# Patient Record
Sex: Male | Born: 1975 | Race: White | Hispanic: No | Marital: Single | Smoking: Current every day smoker
Health system: Southern US, Community
[De-identification: ages and names within clinical notes are randomized; demographics above are authoritative.]

## PROBLEM LIST (undated history)

## (undated) DIAGNOSIS — I252 Old myocardial infarction: Secondary | ICD-10-CM

---

## 2016-05-12 ENCOUNTER — Emergency Department (HOSPITAL_COMMUNITY)
Admission: EM | Admit: 2016-05-12 | Discharge: 2016-05-12 | Disposition: A | Discharge status: Home / Self Care | Payer: Self-pay | Attending: Emergency Medicine | Admitting: Emergency Medicine

## 2016-05-12 ENCOUNTER — Emergency Department (HOSPITAL_COMMUNITY): Payer: Self-pay

## 2016-05-12 ENCOUNTER — Encounter (HOSPITAL_COMMUNITY): Payer: Self-pay | Admitting: Emergency Medicine

## 2016-05-12 DIAGNOSIS — R1030 Lower abdominal pain, unspecified: Secondary | ICD-10-CM | POA: Insufficient documentation

## 2016-05-12 DIAGNOSIS — R0789 Other chest pain: Secondary | ICD-10-CM | POA: Insufficient documentation

## 2016-05-12 DIAGNOSIS — I252 Old myocardial infarction: Secondary | ICD-10-CM | POA: Insufficient documentation

## 2016-05-12 DIAGNOSIS — R079 Chest pain, unspecified: Secondary | ICD-10-CM

## 2016-05-12 DIAGNOSIS — F17210 Nicotine dependence, cigarettes, uncomplicated: Secondary | ICD-10-CM | POA: Insufficient documentation

## 2016-05-12 HISTORY — DX: Old myocardial infarction: I25.2

## 2016-05-12 LAB — CBC
HCT: 38.6 % — ABNORMAL LOW (ref 39.0–52.0)
HEMOGLOBIN: 13.4 g/dL (ref 13.0–17.0)
MCH: 29.7 pg (ref 26.0–34.0)
MCHC: 34.7 g/dL (ref 30.0–36.0)
MCV: 85.6 fL (ref 78.0–100.0)
Platelets: 263 10*3/uL (ref 150–400)
RBC: 4.51 MIL/uL (ref 4.22–5.81)
RDW: 13.5 % (ref 11.5–15.5)
WBC: 8.6 10*3/uL (ref 4.0–10.5)

## 2016-05-12 LAB — COMPREHENSIVE METABOLIC PANEL
ALBUMIN: 3.8 g/dL (ref 3.5–5.0)
ALK PHOS: 64 U/L (ref 38–126)
ALT: 28 U/L (ref 17–63)
AST: 23 U/L (ref 15–41)
Anion gap: 9 (ref 5–15)
BUN: 12 mg/dL (ref 6–20)
CALCIUM: 9.2 mg/dL (ref 8.9–10.3)
CHLORIDE: 106 mmol/L (ref 101–111)
CO2: 23 mmol/L (ref 22–32)
Creatinine, Ser: 0.71 mg/dL (ref 0.61–1.24)
GFR calc Af Amer: >60 mL/min (ref >60)
GFR calc non Af Amer: >60 mL/min (ref >60)
GLUCOSE: 93 mg/dL (ref 65–99)
Potassium: 4.1 mmol/L (ref 3.5–5.1)
SODIUM: 138 mmol/L (ref 135–145)
Total Bilirubin: 1.1 mg/dL (ref 0.3–1.2)
Total Protein: 6.6 g/dL (ref 6.5–8.1)

## 2016-05-12 LAB — LIPASE, BLOOD: LIPASE: 20 U/L (ref 11–51)

## 2016-05-12 LAB — I-STAT TROPONIN, ED: TROPONIN I, POC: 0 ng/mL (ref 0.00–0.08)

## 2016-05-12 MED ORDER — IBUPROFEN 800 MG PO TABS
800.0000 mg | ORAL_TABLET | Freq: Once | ORAL | Status: AC
  Administered 2016-05-12: 800 mg via ORAL
  Filled 2016-05-12: qty 1

## 2016-05-12 NOTE — ED Provider Notes (Signed)
[facility]-EMERGENCY DEPT Provider Note   CSN: [id number] Arrival date & time: 05/12/16  1027     History   Chief Complaint Chief Complaint  Patient presents with  . Chest Pain  . Abdominal Pain    HPI Peter Everett is a 41 y.o. male.  The history is provided by the patient.  Chest Pain   This is a new problem. The problem occurs constantly. The problem has not changed since onset.The pain is present in the substernal region. The pain is moderate. The quality of the pain is described as sharp. The pain radiates to the left shoulder. Duration of episode(s) is 4 hours. The symptoms are aggravated by certain positions. Associated symptoms include abdominal pain. He has tried nitroglycerin (aspirin) for the symptoms. Risk factors include male gender.  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  Abdominal Pain   This is a new problem. Episode onset: 4 hours ago. The problem occurs constantly. The problem has not changed since onset.Pain location: right lower. The pain is mild. Nothing aggravates the symptoms. Nothing relieves the symptoms.    Past Medical History:  Diagnosis Date  . MI, old     There are no active problems to display for this patient.   No past surgical history on file.     Home Medications    Prior to Admission medications   Not on File    Family History No family history on file.  Social History Social History  Substance Use Topics  . Smoking status: Current Every Day Smoker    Packs/day: 2.00    Types: Cigarettes  . Smokeless tobacco: Current User    Types: Chew  . Alcohol use No     Allergies   Patient has  no known allergies.   Review of Systems Review of Systems  Cardiovascular: Positive for chest pain.  Gastrointestinal: Positive for abdominal pain.  All other systems reviewed and are negative.    Physical Exam Updated Vital Signs BP 102/71 (BP Location: Right Arm)   Pulse 75   Temp 98.2 F (36.8 C) (Oral)   Resp 16   SpO2 95%   Physical Exam  Constitutional: He is oriented to person, place, and time. He appears well-developed and well-nourished. No distress.  HENT:  Head: Normocephalic and atraumatic.  Nose: Nose normal.  Eyes: Conjunctivae are normal.  Neck: Neck supple. No tracheal deviation present.  Cardiovascular: Normal rate, regular rhythm and normal heart sounds.   Pulmonary/Chest: Effort normal and breath sounds normal. No respiratory distress.  Abdominal: Soft. He exhibits no distension. There is no tenderness. There is no rebound and no guarding.  Neurological: He is alert and oriented to person, place, and time.  Skin: Skin is warm and dry.  Psychiatric: He has a normal mood and affect.  Vitals reviewed.    ED Treatments / Results  Labs (all labs ordered are listed, but only abnormal results are displayed) Labs Reviewed  CBC - Abnormal; Notable for the following:       Result Value   HCT 38.6 (*)    All other components within normal limits  LIPASE, BLOOD  COMPREHENSIVE METABOLIC PANEL  URINALYSIS, ROUTINE W REFLEX MICROSCOPIC  I-STAT TROPOININ, ED    EKG  EKG Interpretation  Date/Time:  Wednesday May 12 2016 10:43:23 EST Ventricular Rate:  77 PR Interval:  134 QRS Duration: 88 QT Interval:  378 QTC Calculation: 427 R Axis:   58 Text Interpretation:  Sinus rhythm with Blocked Premature atrial complexes Otherwise normal ECG No previous tracing Confirmed by Masie Bermingham MD, Baruch Lewers [phone]) on 05/12/2016 10:46:34 AM       Radiology Dg Chest 2 View  Result Date: 05/12/2016 CLINICAL DATA:  Chest pain and shortness of breath. Chest pain radiates  to the left arm. EXAM: CHEST  2 VIEW COMPARISON:  None. FINDINGS: Trachea is midline. Heart size is accentuated by AP technique and somewhat low lung volumes. Lungs are clear. No pleural fluid. IMPRESSION: No acute findings. Electronically Signed   By: Leanna Battles M.D.   On: 05/12/2016 11:11    Procedures Procedures (including critical care time)  Medications Ordered in ED Medications  ibuprofen (ADVIL,MOTRIN) tablet 800 mg (800 mg Oral Given 05/12/16 1308)     Initial Impression / Assessment and Plan / ED Course  I have reviewed the triage vital signs and the nursing notes.  Pertinent labs & imaging results that were available during my care of the patient were reviewed by me and considered in my medical decision making (see chart for details).     41 year old male presents with sudden onset sharp left-sided chest pain that has completely resolved upon arrival to the emergency department. It is highly atypical in nature. He states that he has a history of prior MI but there are no records of him visiting the hospital in [facility] where he states this occurred. He also states that he has no stents in place. He tells me that he has had his gallbladder out at [facility] in [place] but there is also no record available from them to demonstrate this. He has well-healed abdominal incisions. No abdominal tenderness, no abnormalities on exam. He states that he has "a high tolerance for pain and usually Tylenol and ibuprofen does not work. I confronted the patient about his high risk behavior for drug-seeking and told him that I was concerned that he may have a problem. He tells me that he travels often for his job. He is in no acute distress, is afebrile with stable vital signs and lab work including troponin, liver function tests, CBC and metabolic panel with lipase are all unremarkable. At this time I do not feel that there is evidence of a medical emergency. Return precautions  were discussed for worsening or new concerning symptoms and I recommended the patient follow with a primary care physician to further evaluate his pain if it recurs before now he is pain-free.  Final Clinical Impressions(s) / ED Diagnoses   Final diagnoses:  Nonspecific chest pain  Lower abdominal pain    New Prescriptions New Prescriptions   No medications on file     Lyndal Pulley, MD 05/12/16 1320

## 2016-05-12 NOTE — ED Notes (Signed)
Pt unable to void at this time. Pt state "I peed right before I came here."

## 2016-05-12 NOTE — ED Notes (Signed)
Pt verbalized understanding of d/c instructions and has no further questions. Pt stable and NAD.  

## 2016-05-12 NOTE — ED Triage Notes (Signed)
Pt from [facility] via [facility] with c/o central sharp chest pain radiating to the left arm starting approx 2 hours [place] [employer]  Pt reports 10/10 pain with no relief from 1 nitro.  Given 3 24 mg aspirin.  Denies N/V, SOB, or diaphoresis.  Pt additionally reports RLQ pain 10/10 from flank to back with no urinary complaints.  Pt reports hx of gallbladder removal 6 weeks ago and hx of MI but pain was a pressure at that time.  NAD, A&O.

## 2018-04-19 IMAGING — DX DG CHEST 2V
2 series · 2 of 2 positions shown · non-contrast
Comparison: None.

CLINICAL DATA: Chest pain and shortness of breath. Chest pain
radiates to the left arm.

EXAM:
CHEST  2 VIEW

[chest lat]
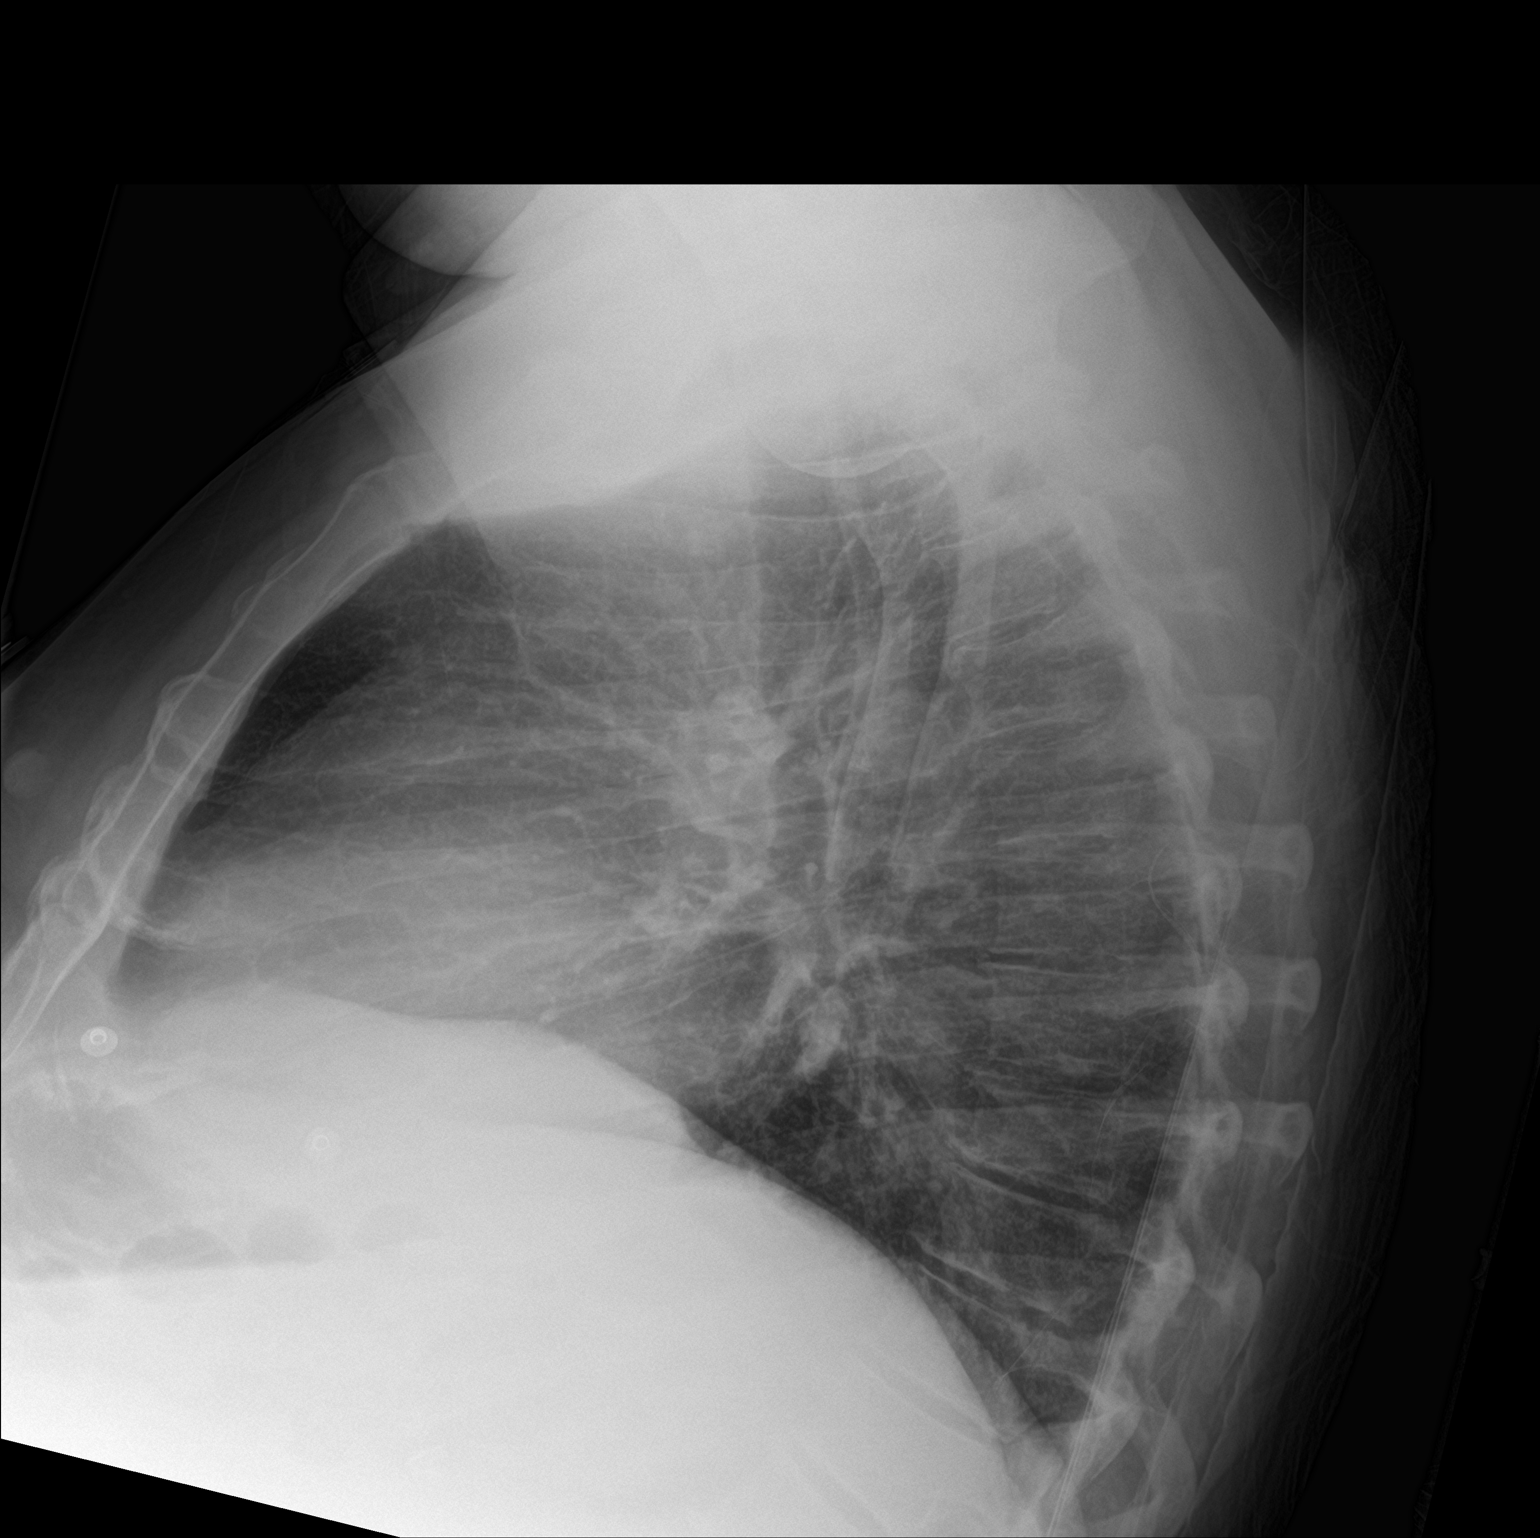

[chest ap]
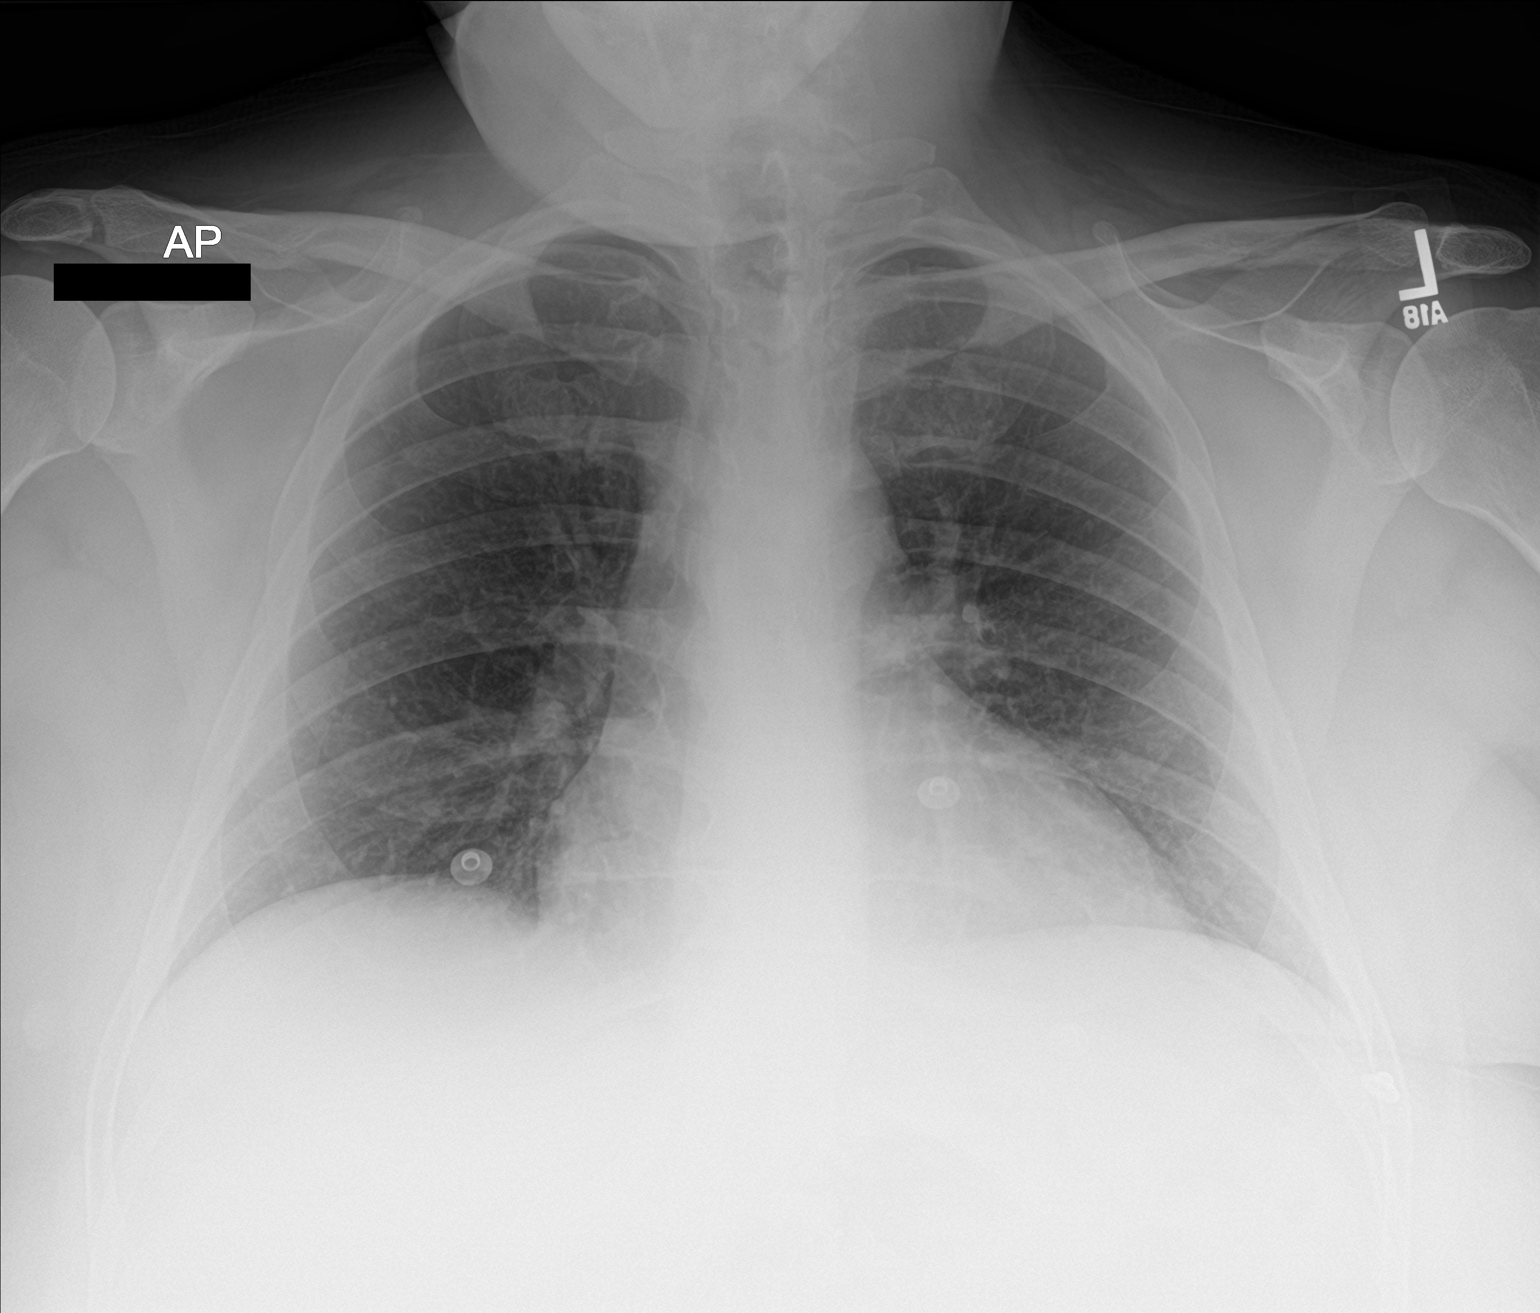

[2 of 2 positions shown; findings below may reference images not displayed]

FINDINGS: Trachea is midline. Heart size is accentuated by AP technique and
somewhat low lung volumes. Lungs are clear. No pleural fluid.
IMPRESSION: No acute findings.
